# Patient Record
Sex: Male | Born: 1980 | Race: White | Hispanic: No | Marital: Married | Smoking: Current some day smoker
Health system: Southern US, Community
[De-identification: ages and names within clinical notes are randomized; demographics above are authoritative.]

---

## 2016-01-08 ENCOUNTER — Emergency Department (HOSPITAL_COMMUNITY): Payer: BLUE CROSS/BLUE SHIELD

## 2016-01-08 ENCOUNTER — Emergency Department (HOSPITAL_COMMUNITY)
Admission: EM | Admit: 2016-01-08 | Discharge: 2016-01-08 | Disposition: A | Discharge status: Home / Self Care | Payer: BLUE CROSS/BLUE SHIELD | Attending: Emergency Medicine | Admitting: Emergency Medicine

## 2016-01-08 ENCOUNTER — Encounter (HOSPITAL_COMMUNITY): Payer: Self-pay | Admitting: Emergency Medicine

## 2016-01-08 DIAGNOSIS — S82431A Displaced oblique fracture of shaft of right fibula, initial encounter for closed fracture: Secondary | ICD-10-CM | POA: Insufficient documentation

## 2016-01-08 DIAGNOSIS — R000 Tachycardia, unspecified: Secondary | ICD-10-CM | POA: Diagnosis not present

## 2016-01-08 DIAGNOSIS — S83014A Lateral dislocation of right patella, initial encounter: Secondary | ICD-10-CM | POA: Insufficient documentation

## 2016-01-08 DIAGNOSIS — Y999 Unspecified external cause status: Secondary | ICD-10-CM | POA: Diagnosis not present

## 2016-01-08 DIAGNOSIS — Y929 Unspecified place or not applicable: Secondary | ICD-10-CM | POA: Diagnosis not present

## 2016-01-08 DIAGNOSIS — M25461 Effusion, right knee: Secondary | ICD-10-CM | POA: Insufficient documentation

## 2016-01-08 DIAGNOSIS — Z79899 Other long term (current) drug therapy: Secondary | ICD-10-CM | POA: Diagnosis not present

## 2016-01-08 DIAGNOSIS — R52 Pain, unspecified: Secondary | ICD-10-CM

## 2016-01-08 DIAGNOSIS — F17290 Nicotine dependence, other tobacco product, uncomplicated: Secondary | ICD-10-CM | POA: Diagnosis not present

## 2016-01-08 DIAGNOSIS — S8991XA Unspecified injury of right lower leg, initial encounter: Secondary | ICD-10-CM | POA: Diagnosis present

## 2016-01-08 DIAGNOSIS — Y9389 Activity, other specified: Secondary | ICD-10-CM | POA: Insufficient documentation

## 2016-01-08 DIAGNOSIS — S82001A Unspecified fracture of right patella, initial encounter for closed fracture: Secondary | ICD-10-CM

## 2016-01-08 DIAGNOSIS — X501XXA Overexertion from prolonged static or awkward postures, initial encounter: Secondary | ICD-10-CM | POA: Insufficient documentation

## 2016-01-08 DIAGNOSIS — S82401A Unspecified fracture of shaft of right fibula, initial encounter for closed fracture: Secondary | ICD-10-CM

## 2016-01-08 DIAGNOSIS — S83004A Unspecified dislocation of right patella, initial encounter: Secondary | ICD-10-CM

## 2016-01-08 MED ORDER — DIAZEPAM 5 MG/ML IJ SOLN
5.0000 mg | Freq: Once | INTRAMUSCULAR | Status: AC
Start: 2016-01-08 — End: 2016-01-08
  Administered 2016-01-08: 5 mg via INTRAVENOUS
  Filled 2016-01-08: qty 2

## 2016-01-08 MED ORDER — HYDROMORPHONE HCL 1 MG/ML IJ SOLN
1.0000 mg | Freq: Once | INTRAMUSCULAR | Status: AC
  Administered 2016-01-08: 1 mg via INTRAVENOUS
  Filled 2016-01-08: qty 1

## 2016-01-08 MED ORDER — CYCLOBENZAPRINE HCL 10 MG PO TABS: 10.0000 mg | ORAL_TABLET | Freq: Three times a day (TID) | ORAL | Status: AC | PRN

## 2016-01-08 MED ORDER — PROPOFOL 10 MG/ML IV BOLUS
0.5000 mg/kg | Freq: Once | INTRAVENOUS | Status: DC
Start: 2016-01-08 — End: 2016-01-08

## 2016-01-08 MED ORDER — OXYCODONE-ACETAMINOPHEN 5-325 MG PO TABS: 1.0000 | ORAL_TABLET | ORAL | Status: AC | PRN

## 2016-01-08 NOTE — ED Notes (Addendum)
Per EMS, patient was using a chainsaw, which got stuck. Patient "twisted wrong" and is now in pain. Pain and swelling to right knee; also reports right ankle pain. Patient was given 150 fentanyl IV

## 2016-01-08 NOTE — ED Notes (Signed)
Bed: [id number] Expected date:  Expected time:  Means of arrival:  Comments: 35 yo Knee injury

## 2016-01-08 NOTE — ED Notes (Signed)
Discharge instructions, follow up care, and rx x3 reviewed with patient and patient's significant other. Patient and significant other verbalized understanding.

## 2016-01-08 NOTE — ED Notes (Signed)
X-ray at bedside

## 2016-01-08 NOTE — Discharge Instructions (Signed)
Read the information below.  Use the prescribed medication as directed.  Please discuss all new medications with your pharmacist.  Do not take additional tylenol while taking the prescribed pain medication to avoid overdose.  You may return to the Emergency Department at any time for worsening condition or any new symptoms that concern you.    If you develop uncontrolled pain, weakness or numbness of the extremity, severe discoloration of the skin, or you are unable to move your toes, return to the ER for a recheck.      Knee Effusion Knee effusion means that you have excess fluid in your knee joint. This can cause pain and swelling in your knee. This may make your knee more difficult to bend and move. That is because there is increased pain and pressure in the joint. If there is fluid in your knee, it often means that something is wrong inside your knee, such as severe arthritis, abnormal inflammation, or an infection. Another common cause of knee effusion is an injury to the knee muscles, ligaments, or cartilage. HOME CARE INSTRUCTIONS  Use crutches as directed by your health care provider.  Wear a knee brace as directed by your health care provider.  Apply ice to the swollen area:  Put ice in a plastic bag.  Place a towel between your skin and the bag.  Leave the ice on for 20 minutes, 2-3 times per day.  Keep your knee raised (elevated) when you are sitting or lying down.  Take medicines only as directed by your health care provider.  Do any rehabilitation or strengthening exercises as directed by your health care provider.  Rest your knee as directed by your health care provider. You may start doing your normal activities again when your health care provider approves.   Keep all follow-up visits as directed by your health care provider. This is important. SEEK MEDICAL CARE IF:  You have ongoing (persistent) pain in your knee. SEEK IMMEDIATE MEDICAL CARE IF:  You have increased  swelling or redness of your knee.  You have severe pain in your knee.  You have a fever.   This information is not intended to replace advice given to you by your health care provider. Make sure you discuss any questions you have with your health care provider.   Document Released: 09/22/2003 Document Revised: 07/23/2014 Document Reviewed: 02/15/2014 Elsevier Interactive Patient Education 2016 [employer] Inc[employer]  Patellar Dislocation A patellar dislocation occurs when your kneecap (patella) slips out of its normal position in a groove in front of the lower end of your thighbone (femur). This groove is called the patellofemoral groove.  CAUSES The kneecap is normally positioned over the front of the knee joint at the base of the thighbone. A kneecap can be dislocated when:  The kneecap is out of place (patellar tracking disorder), and force is applied.  The foot is firmly planted pointing outward, and the knee bends with the thigh turned inward. This kind of injury is common during many sports activities.  The inner edge of the kneecap is hit, pushing it toward the outer side of the leg. SIGNS AND SYMPTOMS  Severe pain.  A misshapen knee that looks like a bone is out of position.  A popping sensation, followed by a feeling that something is out of place.  Inability to bend or straighten the knee.  Knee swelling.  Cool, pale skin or numbness and tingling in or below the affected knee. DIAGNOSIS  Your health care provider  will physically examine the injured area. An X-ray exam may be done to make sure a bone fracture has not occurred. In some cases, your health care provider may look inside your knee joint with an instrument much like a pencil-sized telescope (arthroscope). This may be done to make sure you have no loose cartilage in your joint. Loose cartilage is not visible on an X-ray image. TREATMENT  In many instances, the patella can be guided back into position without much  difficulty. It often goes back into position by straightening the leg. Often, nothing more may be needed other than a brief period of immobilization followed by the exercises your health care provider recommends. If patellar dislocation starts to become frequent after the first incident, surgery may be needed to prevent your patella from slipping out of place. HOME CARE INSTRUCTIONS   Only take over-the-counter or prescription medicines for pain, discomfort, or fever as directed by your health care provider.  Use a knee brace if directed to do so by your health care provider.  Use crutches as instructed.  Apply ice to the injured knee:  Put ice in a plastic bag.  Place a towel between your skin and the bag.  Leave the ice on for 20 minutes, 2-3 times a day.  Follow your health care provider's instructions for doing any recommended range-of-motion exercises or other exercises. SEEK IMMEDIATE MEDICAL CARE IF:  You have increased pain or swelling in the knee that is not relieved with medicine.  You have increasing inflammation in the knee.  You have locking or catching of your knee. MAKE SURE YOU:  Understand these instructions.  Will watch your condition.  Will get help right away if you are not doing well or get worse.   This information is not intended to replace advice given to you by your health care provider. Make sure you discuss any questions you have with your health care provider.   Document Released: 03/27/2001 Document Revised: 04/22/2013 Document Reviewed: 02/11/2013 Elsevier Interactive Patient Education 2016 Else[employer] Inc.  Patellar Fracture, Adult A patellar fracture is a break in your kneecap (patella).  CAUSES   A direct blow to the knee or a fall is usually the cause of a broken patella.  A very hard and strong bending of your knee can cause a patellar fracture. RISK FACTORS Involvement in contact sports, especially sports that involve a lot of  jumping. SIGNS AND SYMPTOMS   Tender and swollen knee.  Pain when you move your knee, especially when you try to straighten out your leg.  Difficulty walking or putting weight on your knee.  Misshapen knee (as if a bone is out of place). DIAGNOSIS  Patellar fracture is usually diagnosed with a physical exam and an X-ray exam. TREATMENT  Treatment depends on the type of fracture:  If your patella is still in the right position after the fracture and you can still straighten your leg out, you can usually be treated with a splint or cast for 4-6 weeks.  If your patella is broken into multiple small pieces but you are able to straighten your leg, you can usually be treated with a splint or cast for 4-6 weeks. Sometimes your patella may need to be removed before the cast is applied.  If you cannot straighten out your leg after a patellar fracture, then surgery is required to hold the bony fragments together until they heal. A cast or splint will be applied for 4-6 weeks. HOME CARE INSTRUCTIONS  Only take over-the-counter or prescription medicines for pain, discomfort, or fever as directed by your health care provider.  Use crutches as directed, and exercise the leg as directed.  Apply ice to the injured area:  Put ice in a plastic bag.  Place a towel between your skin and the bag.  Leave the ice on for 20 minutes, 2-3 times a day.  Elevate the affected knee above the level of your heart. SEEK MEDICAL CARE IF:  You suspect you have significantly injured your knee.  You hear a pop after a knee injury.  Your knee is misshapen after a knee injury.  You have pain when you move your knee.  You have difficulty walking or putting weight on your knee.  You cannot fully move your knee. SEEK IMMEDIATE MEDICAL CARE IF:  You have redness, swelling, or increasing pain in your knee.  You have a fever.   This information is not intended to replace advice given to you by your health  care provider. Make sure you discuss any questions you have with your health care provider.   Document Released: 03/31/2003 Document Revised: 04/22/2013 Document Reviewed: 02/11/2013 Elsevier Interactive Patient Education 2016 Elsevier Inc.  Fibular Ankle Fracture Treated With or Without Immobilization, Adult A fibular fracture at your ankle is a break (fracture) bone in the smallest of the two bones in your lower leg, located on the outside of your leg (fibula) close to the area at your ankle joint. CAUSES  Rolling your ankle.  Twisting your ankle.  Extreme flexing or extending of your foot.  Severe force on your ankle as when falling from a distance. RISK FACTORS  Jumping activities.  Participation in sports.  Osteoporosis.  Advanced age.  Previous ankle injuries. SIGNS AND SYMPTOMS  Pain.  Swelling.  Inability to put weight on injured ankle.  Bruising.  Bone deformities at site of injury. DIAGNOSIS  This fracture is diagnosed with the help of an X-ray exam. TREATMENT  If the fractured bone did not move out of place it usually will heal without problems and does casting or splinting. If immobilization is needed for comfort or the fractured bone moved out of place and will not heal properly with immobilization, a cast or splint will be used. HOME CARE INSTRUCTIONS   Apply ice to the area of injury:  Put ice in a plastic bag.  Place a towel between your skin and the bag.  Leave the ice on for 20 minutes, 2-3 times a day.  Use crutches as directed. Resume walking without crutches as directed by your health care provider.  Only take over-the-counter or prescription medicines for pain, discomfort, or fever as directed by your health care provider.  If you have a removable splint or boot, do not remove the boot unless directed by your health care provider. SEEK MEDICAL CARE IF:   You have continued pain or more swelling  The medications do not control the  pain. SEEK IMMEDIATE MEDICAL CARE IF:  You develop severe pain in the leg or foot.  Your skin or nails below the injury turn blue or grey or feel cold or numb. MAKE SURE YOU:   Understand these instructions.  Will watch your condition.  Will get help right away if you are not doing well or get worse.   This information is not intended to replace advice given to you by your health care provider. Make sure you discuss any questions you have with your health care provider.  Document Released: 07/02/2005 Document Revised: 07/23/2014 Document Reviewed: 02/11/2013 Elsevier Interactive Patient Education [employer] Else[employer] Inc[employer]

## 2016-01-08 NOTE — ED Provider Notes (Addendum)
CSN: [id number]     Arrival date & time 01/08/16  1116 History   First MD Initiated Contact with Patient 01/08/16 1119     Chief Complaint  Patient presents with  . Knee Injury     (Consider location/radiation/quality/duration/timing/severity/associated sxs/prior Treatment) The history is provided by the patient.    Patient presents with severe knee pain and more mild right ankle pain after twisting it while trying to pull a chainsaw that got stuck in a tree.  States he was pulling the chainsaw and believes he rolled his right ankle and then his knee became dislocated.  He fell to the right but was able to control his fall to the ground.  Denies hitting head or LOC.  Denies weakness or numbness of his foot.  Denies other injury.  He was not cut by the chainsaw.    History reviewed. No pertinent past medical history. History reviewed. No pertinent past surgical history. No family history on file. Social History  Substance Use Topics  . Smoking status: Current Some Day Smoker    Types: Pipe  . Smokeless tobacco: None  . Alcohol Use: Yes    Review of Systems  Constitutional: Negative for fever and chills.  Cardiovascular: Negative for leg swelling.  Musculoskeletal: Positive for arthralgias.  Skin: Negative for wound.  Allergic/Immunologic: Negative for immunocompromised state.  Neurological: Negative for weakness and numbness.  Hematological: Does not bruise/bleed easily.  Psychiatric/Behavioral: Negative for self-injury.      Allergies  Review of patient's allergies indicates not on file.  Home Medications   Prior to Admission medications   Not on File   BP 149/98 mmHg  Pulse 108  Temp(Src) 97.7 F (36.5 C) (Oral)  Resp 16  Ht 5\' 8"  (1.727 m)  Wt 74.844 kg  BMI 25.09 kg/m2  SpO2 98% Physical Exam  Constitutional: He appears well-developed and well-nourished. No distress.  Uncomfortable appearing   HENT:  Head: Normocephalic and atraumatic.  Eyes:  Conjunctivae are normal.  Neck: Neck supple.  Cardiovascular: Tachycardia present.   Pulmonary/Chest: Effort normal.  Musculoskeletal:  Right lower extremity:  Knee malaligned.  Skin is closed, no noted laceration.  Right distal lateral lower leg (over distal fibula) tender to palpation. Distal sensation intact.  Foot is normal temperature.  Dorsalis pedis pulse is decreased but palpable.    Neurological: He is alert.  Skin: He is not diaphoretic.  No wounds, discoloration, or break in skin noted.    Nursing note and vitals reviewed.   ED Course  Reduction of dislocation Date/Time: 01/08/2016 1:42 PM Performed by: Trixie DredgeWEST, Kaydance Bowie Authorized by: Trixie DredgeWEST, Eyden Dobie Consent: Verbal consent obtained. Consent given by: patient and spouse Patient understanding: patient states understanding of the procedure being performed Local anesthesia used: no Patient tolerance: Patient tolerated the procedure well with no immediate complications Comments: Patella reduced by Khalen Styer  Right leg extended at the knee with gentle direct pressure on the laterally displaced patella, pushing medially until the patella slid back into position.  Pt maintained sensation and pulses throughout, tolerated procedure well.     (including critical care time) Labs Review Labs Reviewed - No data to display  Imaging Review Dg Knee 1-2 Views Right  01/08/2016  CLINICAL DATA:  Right knee pain after injury today EXAM: RIGHT KNEE - 1-2 VIEW COMPARISON:  None. FINDINGS: Lateral right patella dislocation. No malalignment between the distal femur and proximal tibia. No fracture or joint effusion. No suspicious focal osseous lesion. IMPRESSION: Lateral right patella dislocation.  No fracture. Electronically Signed   By: Delbert PhenixJason A Poff M.D.   On: 01/08/2016 12:28   Dg Ankle Complete Right  01/08/2016  CLINICAL DATA:  Right ankle injury today.  Initial encounter. EXAM: RIGHT ANKLE - COMPLETE 3+ VIEW COMPARISON:  None. FINDINGS: There is an  oblique fracture of the distal fibula that demonstrates minimal displacement. The ankle mortise shows normal alignment. No other fractures identified. IMPRESSION: Minimally displaced oblique fracture of the distal fibula. Electronically Signed   By: Irish LackGlenn  Yamagata M.D.   On: 01/08/2016 12:29   Dg Knee Right Port  01/08/2016  CLINICAL DATA:  Post reduction of RIGHT patella EXAM: PORTABLE RIGHT KNEE - 1-2 VIEW COMPARISON:  None FINDINGS: Osseous mineralization normal. Patella appears normally aligned. Moderate RIGHT knee joint effusion. Pane calcific density is seen projecting at the medial margin of the inferior patella cannot exclude small patellar avulsion fragment. IMPRESSION: Reduction of previously identified patellar dislocation with note of a moderate-sized knee joint effusion. Question tiny avulsion fragment at medial margin of inferior patella. Electronically Signed   By: Ulyses SouthwardMark  Boles M.D.   On: 01/08/2016 14:36   I have personally reviewed and evaluated these images and lab results as part of my medical decision-making.   EKG Interpretation None       11:54 AM     MDM   Final diagnoses:  Patellar dislocation, right, initial encounter  Knee effusion, right  Fracture, patella, right, closed, initial encounter  Fibula fracture, right, closed, initial encounter   Afebrile, nontoxic patient with injury to his right leg while attempting to pull a chain saw out of a tree, twisted ankle and fell to the right, twisting his knee.  Found to have fracture distal fibula and patella dislocation.  Discussed patient and plan with Dr Bebe ShaggyWickline who also saw the patient.  Patella reduced after dilaudid and valium with success.  Pt continued to have pulses in foot throughout visit.  Repeat xray after patella reduction demonstrated knee effusion and possible avulsion fracture of patella.  Pt placed in knee immobilizer, short leg splint.  Discussed RICE.  Pt to be nonweightbearing.  D/C home with pain  medication, close orthopedic follow up.   Discussed result, findings, treatment, and follow up  with patient.  Pt given return precautions.  Pt verbalizes understanding and agrees with plan.        Trixie Dredgemily Lelania Bia, PA-C 01/08/16 1524  Zadie Rhineonald Wickline, MD 01/08/16 [address]1537  Sarina Robleto, PA-C 01/22/16 1309  Zadie Rhineonald Wickline, MD 01/22/16 [phone]

## 2017-01-30 IMAGING — DX DG KNEE 1-2V PORT*R*
2 series · 2 of 2 positions shown · non-contrast
Comparison: None

CLINICAL DATA: Post reduction of RIGHT patella

EXAM:
PORTABLE RIGHT KNEE - 1-2 VIEW

[knee ap]
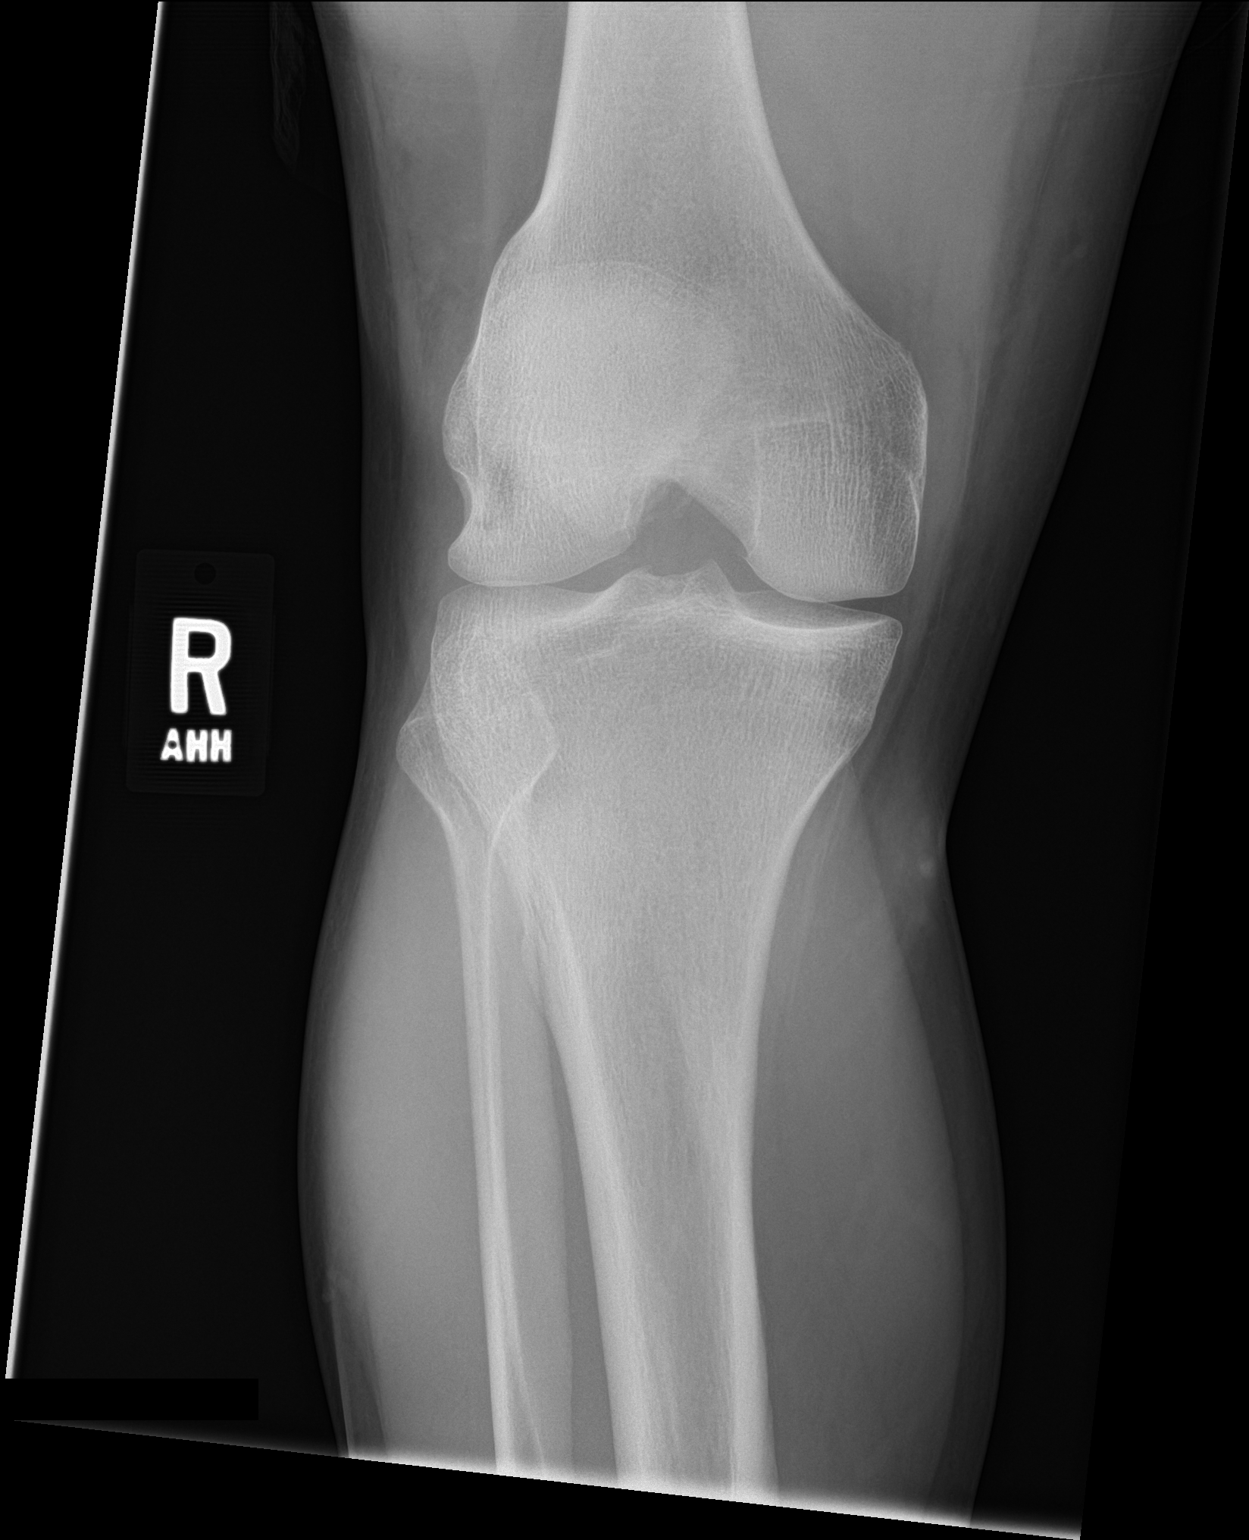

[knee lat]
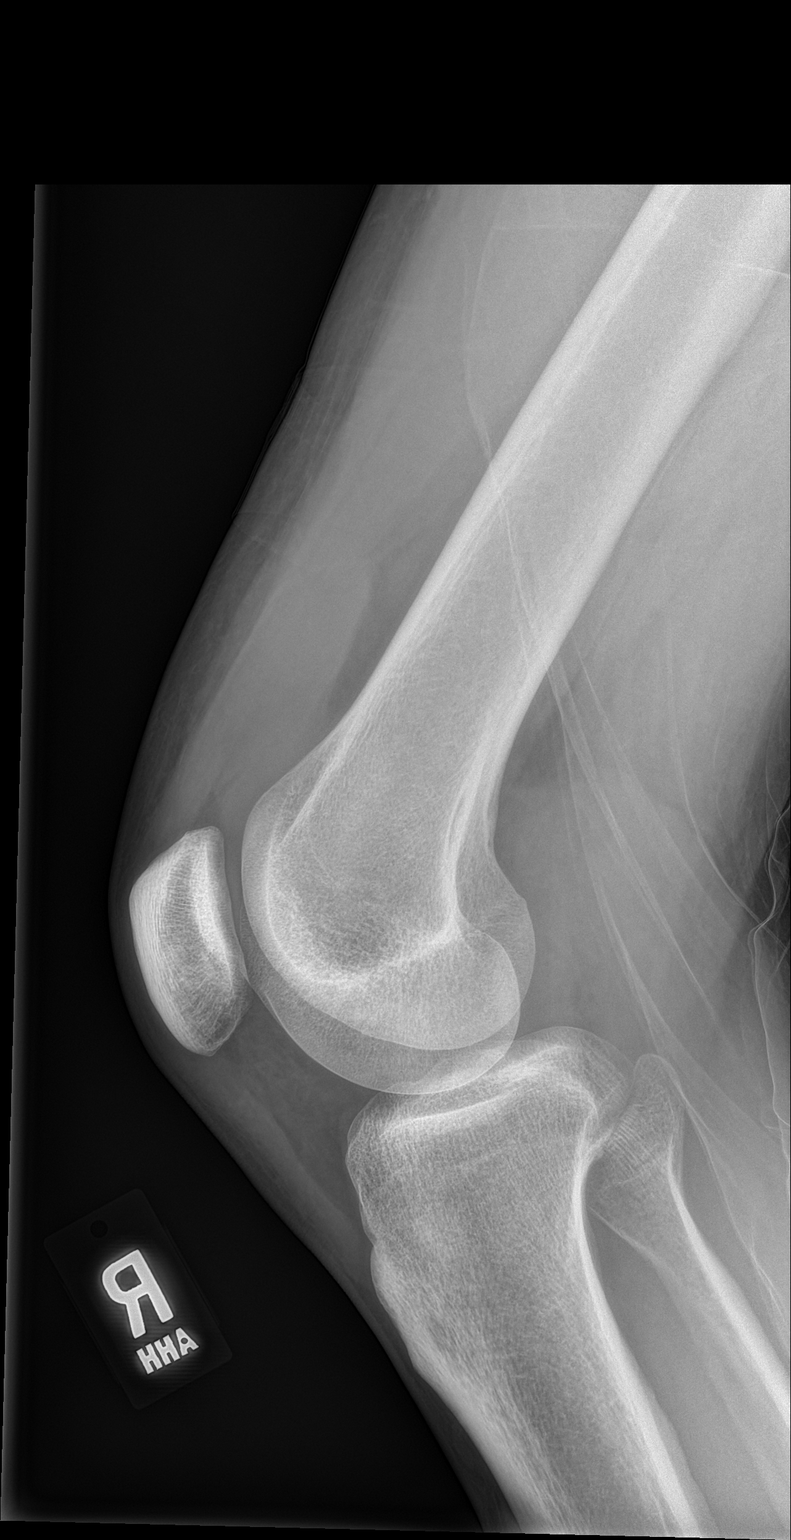

[2 of 2 positions shown; findings below may reference images not displayed]

FINDINGS: Osseous mineralization normal.

Patella appears normally aligned.

Moderate RIGHT knee joint effusion.

[place] calcific density is seen projecting at the medial margin of the
inferior patella cannot exclude small patellar avulsion fragment.
IMPRESSION: Reduction of previously identified patellar dislocation with note of
a moderate-sized knee joint effusion.

Question tiny avulsion fragment at medial margin of inferior
patella.
